# Patient Record
Sex: Male | Born: 1965 | Race: White | Hispanic: No | Marital: Single | Smoking: Never smoker
Health system: Southern US, Community
[De-identification: ages and names within clinical notes are randomized; demographics above are authoritative.]

## PROBLEM LIST (undated history)

## (undated) DIAGNOSIS — H9313 Tinnitus, bilateral: Secondary | ICD-10-CM

## (undated) DIAGNOSIS — N400 Enlarged prostate without lower urinary tract symptoms: Secondary | ICD-10-CM

## (undated) DIAGNOSIS — E785 Hyperlipidemia, unspecified: Secondary | ICD-10-CM

## (undated) DIAGNOSIS — G43809 Other migraine, not intractable, without status migrainosus: Secondary | ICD-10-CM

## (undated) HISTORY — DX: Hyperlipidemia, unspecified: E78.5

## (undated) HISTORY — PX: KNEE SURGERY: SHX244

## (undated) HISTORY — DX: Tinnitus, bilateral: H93.13

## (undated) HISTORY — DX: Benign prostatic hyperplasia without lower urinary tract symptoms: N40.0

## (undated) HISTORY — PX: APPENDECTOMY: SHX54

## (undated) HISTORY — PX: VASECTOMY: SHX75

---

## 2020-09-10 ENCOUNTER — Emergency Department (HOSPITAL_COMMUNITY)
Admission: EM | Admit: 2020-09-10 | Discharge: 2020-09-10 | Disposition: A | Discharge status: Home / Self Care | Payer: BC Managed Care – PPO | Attending: Emergency Medicine | Admitting: Emergency Medicine

## 2020-09-10 ENCOUNTER — Emergency Department (HOSPITAL_COMMUNITY): Payer: BC Managed Care – PPO

## 2020-09-10 ENCOUNTER — Other Ambulatory Visit: Payer: Self-pay

## 2020-09-10 DIAGNOSIS — R110 Nausea: Secondary | ICD-10-CM | POA: Diagnosis not present

## 2020-09-10 DIAGNOSIS — R42 Dizziness and giddiness: Secondary | ICD-10-CM

## 2020-09-10 DIAGNOSIS — H539 Unspecified visual disturbance: Secondary | ICD-10-CM | POA: Diagnosis not present

## 2020-09-10 LAB — URINALYSIS, ROUTINE W REFLEX MICROSCOPIC
Bilirubin Urine: NEGATIVE
Glucose, UA: NEGATIVE mg/dL
Hgb urine dipstick: NEGATIVE
Ketones, ur: NEGATIVE mg/dL
Leukocytes,Ua: NEGATIVE
Nitrite: NEGATIVE
Protein, ur: NEGATIVE mg/dL
Specific Gravity, Urine: 1.004 — ABNORMAL LOW (ref 1.005–1.030)
pH: 6 (ref 5.0–8.0)

## 2020-09-10 LAB — ETHANOL: Alcohol, Ethyl (B): <10 mg/dL (ref <10)

## 2020-09-10 LAB — CBC WITH DIFFERENTIAL/PLATELET
Abs Immature Granulocytes: 0.03 10*3/uL (ref 0.00–0.07)
Basophils Absolute: 0 10*3/uL (ref 0.0–0.1)
Basophils Relative: 0 %
Eosinophils Absolute: 0 10*3/uL (ref 0.0–0.5)
Eosinophils Relative: 0 %
HCT: 43.3 % (ref 39.0–52.0)
Hemoglobin: 14.9 g/dL (ref 13.0–17.0)
Immature Granulocytes: 0 %
Lymphocytes Relative: 19 %
Lymphs Abs: 1.8 10*3/uL (ref 0.7–4.0)
MCH: 29.7 pg (ref 26.0–34.0)
MCHC: 34.4 g/dL (ref 30.0–36.0)
MCV: 86.4 fL (ref 80.0–100.0)
Monocytes Absolute: 0.8 10*3/uL (ref 0.1–1.0)
Monocytes Relative: 8 %
Neutro Abs: 6.6 10*3/uL (ref 1.7–7.7)
Neutrophils Relative %: 73 %
Platelets: 265 10*3/uL (ref 150–400)
RBC: 5.01 MIL/uL (ref 4.22–5.81)
RDW: 14.3 % (ref 11.5–15.5)
WBC: 9.3 10*3/uL (ref 4.0–10.5)
nRBC: 0 % (ref 0.0–0.2)

## 2020-09-10 LAB — BASIC METABOLIC PANEL
Anion gap: 7 (ref 5–15)
BUN: 9 mg/dL (ref 6–20)
CO2: 25 mmol/L (ref 22–32)
Calcium: 9.7 mg/dL (ref 8.9–10.3)
Chloride: 102 mmol/L (ref 98–111)
Creatinine, Ser: 0.94 mg/dL (ref 0.61–1.24)
GFR, Estimated: >60 mL/min (ref >60)
Glucose, Bld: 122 mg/dL — ABNORMAL HIGH (ref 70–99)
Potassium: 3.8 mmol/L (ref 3.5–5.1)
Sodium: 134 mmol/L — ABNORMAL LOW (ref 135–145)

## 2020-09-10 LAB — RAPID URINE DRUG SCREEN, HOSP PERFORMED
Amphetamines: NOT DETECTED
Barbiturates: NOT DETECTED
Benzodiazepines: NOT DETECTED
Cocaine: NOT DETECTED
Opiates: NOT DETECTED
Tetrahydrocannabinol: NOT DETECTED

## 2020-09-10 LAB — PROTIME-INR
INR: 1.1 (ref 0.8–1.2)
Prothrombin Time: 13.3 seconds (ref 11.4–15.2)

## 2020-09-10 LAB — APTT: aPTT: 23 seconds — ABNORMAL LOW (ref 24–36)

## 2020-09-10 MED ORDER — LORAZEPAM 2 MG/ML IJ SOLN
1.0000 mg | Freq: Once | INTRAMUSCULAR | Status: AC
  Administered 2020-09-10: 1 mg via INTRAVENOUS
  Filled 2020-09-10: qty 1

## 2020-09-10 MED ORDER — MECLIZINE HCL 25 MG PO TABS: 25.0000 mg | ORAL_TABLET | Freq: Three times a day (TID) | ORAL | 0 refills | Status: DC | PRN

## 2020-09-10 NOTE — ED Triage Notes (Addendum)
Pt said all day he has been feeling dizzy and feels like he cant turn his head fast or open his eyes to fast.When he stands to fast the room spins. Pt said he has been sick to stomach today bc of the dizziness. Pt said he has no headache no numbness or tingling in hand or feet. All grips and strengths are normal. Pt said he has changed his dose of Flomax from 1 every 4 days to 1 every 2 days.

## 2020-09-10 NOTE — Discharge Instructions (Signed)

## 2020-09-10 NOTE — ED Notes (Signed)
Pt transported to MRI 

## 2020-09-10 NOTE — ED Provider Notes (Signed)
[facility] [place] EMERGENCY DEPARTMENT Provider Note   CSN: [id number] Arrival date & time: 09/10/20  9767     History Chief Complaint  Patient presents with  . Dizziness    Matthew Harmon is a 55 y.o. male.  The history is provided by the patient and a relative.  Dizziness Quality:  Room spinning Severity:  Severe Onset quality:  Sudden Duration:  12 hours Timing:  Intermittent Progression:  Worsening Chronicity:  New Context: head movement   Relieved by:  Being still Worsened by:  Eye movement, movement and turning head Associated symptoms: nausea and vision changes   Associated symptoms: no chest pain, no headaches, no hearing loss, no shortness of breath, no syncope, no tinnitus and no weakness   Patient presents with dizziness and vertigo.  This began greater than 12 hours prior to arrival.  Patient reports over 2 days ago he had a very brief episode of similar symptoms that resolved spontaneously.  However at approximately 1:30 PM on March 12 he began having the symptoms again.  He reports the room is spinning and is triggered by any head movement. Is causing nausea.  No focal weakness.  He is having difficulty walking.  He reports that he recently increased his Flomax. He does report seeing double at times.  No headache.  No trauma. 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  Review of Systems   Review of Systems  Constitutional: Negative for fever.  HENT: Negative for hearing loss and tinnitus.   Eyes: Positive for visual disturbance.  Respiratory: Negative for shortness of breath.   Cardiovascular: Negative for chest pain and syncope.  Gastrointestinal: Positive for nausea.  Neurological: Positive for dizziness. Negative for  speech difficulty, weakness and headaches.  All other systems reviewed and are negative.   Physical Exam Updated Vital Signs BP (!) 143/95   Pulse 62   Temp 98.5 F (36.9 C)   Resp 13   Ht 1.778 m (5\' 10" )   Wt 83.9 kg   SpO2 99%   BMI 26.54 kg/m   Physical Exam CONSTITUTIONAL: Well developed/well nourished HEAD: Normocephalic/atraumatic EYES: EOMI/PERRL,  no ptosis, patient has both vertical and horizontal nystagmus ENMT: Mucous membranes moist, bilateral TMs occluded by cerumen NECK: supple no meningeal signs, no bruits CV: S1/S2 noted, no murmurs/rubs/gallops noted LUNGS: Lungs are clear to auscultation bilaterally, no apparent distress ABDOMEN: soft, nontender, no rebound or guarding GU:no cva tenderness NEURO:Awake/alert, face symmetric, no arm or leg drift is noted Equal 5/5 strength with shoulder abduction, elbow flex/extension, wrist flex/extension in upper extremities and equal hand grips bilaterally Equal 5/5 strength with hip flexion,knee flex/extension, foot dorsi/plantar flexion Cranial nerves 3/4/5/6/01/06/09/11/12 tested and intact Mild ataxia noted Past-pointing on the right Sensation to light touch intact in all extremities EXTREMITIES: pulses normal, full ROM SKIN: warm, color normal PSYCH: no abnormalities of mood noted  ED Results / Procedures / Treatments   Labs (all labs ordered are listed, but only abnormal results are displayed) Labs Reviewed  BASIC METABOLIC PANEL - Abnormal; Notable for the following components:      Result Value   Sodium 134 (*)    Glucose, Bld 122 (*)    All other components within normal limits  APTT - Abnormal; Notable for the following components:   aPTT 23 (*)    All other components within normal limits  URINALYSIS, ROUTINE W REFLEX MICROSCOPIC - Abnormal; Notable for the following components:   Color, Urine STRAW (*)    Specific Gravity, Urine 1.004 (*)    All other components within normal limits  CBC WITH  DIFFERENTIAL/PLATELET  ETHANOL  PROTIME-INR  RAPID URINE DRUG SCREEN, HOSP PERFORMED    EKG EKG Interpretation  Date/Time:  Sunday September 10 2020 03:28:34 EDT Ventricular Rate:  69 PR Interval:  162 QRS Duration: 94 QT Interval:  386 QTC Calculation: 413 R Axis:   75 Text Interpretation: Normal sinus rhythm with sinus arrhythmia Normal ECG No previous ECGs available Confirmed by Zadie Rhine ([id number]) on 09/10/2020 4:37:57 AM   Radiology MR BRAIN WO CONTRAST  Result Date: 09/10/2020 CLINICAL DATA:  55 year old male with persistent dizziness. EXAM: MRI HEAD WITHOUT CONTRAST TECHNIQUE: Multiplanar, multiecho pulse sequences of the brain and surrounding structures were obtained without intravenous contrast. COMPARISON:  None. FINDINGS: Brain: No restricted diffusion to suggest acute infarction. No midline shift, mass effect, evidence of mass lesion, ventriculomegaly, extra-axial collection or acute intracranial hemorrhage. Cervicomedullary junction and pituitary are within normal limits. Cerebral volume is within normal limits for age. Largely normal gray and white matter signal for age throughout the brain, minimal nonspecific small subcortical white matter foci of T2 and FLAIR hyperintensity primarily in the frontal lobes (series 11, image 16). No cortical encephalomalacia or chronic cerebral blood products. Deep gray matter nuclei, brainstem and cerebellum are within normal limits. Vascular: Major intracranial vascular flow voids are preserved. Skull and upper cervical spine: Negative. Sinuses/Orbits: Rightward gaze. Otherwise negative orbits. Paranasal sinuses and mastoids are clear. Other: Visible internal auditory structures appear normal. Normal stylomastoid foramina. Scalp and face appear negative. IMPRESSION: No acute intracranial abnormality and essentially normal for age noncontrast MRI appearance of the brain. Electronically Signed   By: Odessa Fleming M.D.   On: 09/10/2020 07:11     Procedures Procedures   Medications Ordered in ED Medications  LORazepam (ATIVAN) injection 1 mg (1 mg Intravenous Given 09/10/20 9201)    ED Course  I have reviewed the triage vital signs and the nursing notes.  Pertinent labs & imaging results that were available during my care of the patient were reviewed by me and considered in my medical decision making (see chart for details).    MDM Rules/Calculators/A&P                          6:07 AM Patient presents with symptoms greater than 12 hours.  Patient reports the room is spinning.  Significant findings include horizontal and vertical nystagmus, as well as past-pointing and mild ataxia.  Patient also reports diplopia at times Patient has previous history of CVA. We will proceed with MRI.  Will give Ativan for symptoms.  tPA in stroke considered but not given due to: Onset over 3-4.5hours 7:29 AM MRI negative.  Patient is feeling improved and taking p.o. fluids.  Suspect this is peripheral vertigo.  He also thinks this may be related to increase in his Flomax.  He will scale this back.  He will be referred to otolaryngology.   Final Clinical Impression(s) / ED Diagnoses Final diagnoses:  Vertigo    Rx / DC Orders ED Discharge Orders         Ordered    meclizine (ANTIVERT) 25 MG tablet  3 times  daily PRN        09/10/20 1610           Zadie Rhine, MD 09/10/20 0730

## 2021-05-22 ENCOUNTER — Encounter: Payer: Self-pay | Admitting: Neurology

## 2021-05-22 ENCOUNTER — Other Ambulatory Visit: Payer: Self-pay

## 2021-05-22 ENCOUNTER — Ambulatory Visit: Payer: BC Managed Care – PPO | Admitting: Neurology

## 2021-05-22 VITALS — BP 132/72 | HR 72 | Ht 70.0 in | Wt 184.0 lb

## 2021-05-22 DIAGNOSIS — H8121 Vestibular neuronitis, right ear: Secondary | ICD-10-CM | POA: Diagnosis not present

## 2021-05-22 NOTE — Patient Instructions (Signed)
Follow up with your primary care doctor and return if worse  °

## 2021-05-22 NOTE — Progress Notes (Signed)
[facility]  PATIENT: Matthew Harmon DOB: 1966/06/03  REQUESTING CLINICIAN: Street, Stephanie Coup, * HISTORY FROM: Patient  REASON FOR VISIT: Sensitivity to light an noise recently diagnosed with vestibular neuritis   HISTORICAL  CHIEF COMPLAINT:  Chief Complaint  Patient presents with   New Patient (Initial Visit)    Rm 12. Pt referred for sensory hypersensitivity. Pt c/o of sensitivity to light and noise.    HISTORY OF PRESENT ILLNESS:  This is a 55 year old gentleman past medical history of hypertension, BPH recently diagnosed with right vestibular neuritis after having ongoing dizziness, vertigo, ringing in the ear.  Since the diagnosis of vestibular neuritis he has completed vestibular PT, but stated that the symptoms are still persistent.  Symptoms started on April/May and now is having mostly sensitivity to light and noise, stated that light or noise can cause him to have brain fog, he cannot focus and cannot think straight.  Reported he will have nausea with head movement but no vomiting, will get lightheaded sometimes with head movement.  Work-up done at work [facility] include a brain MRI which showed no acute intracranial abnormality, essentially normal noncontrast MRI brain.  He is present today to rule out central cause of the vestibular neuritis or any vestibular syndrome.  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  Reported that the diagnosis has affected his lifestyle, but he is trying to stay busy by remodeling his house.  He lives with his son who is 9 year old and going to college.     OTHER MEDICAL CONDITIONS: HTN, BPH   REVIEW OF SYSTEMS: Full 14 system review of systems performed and negative with exception of: as noted in the HPI.   ALLERGIES: Allergies  Allergen Reactions   Biaxin Xl [Clarithromycin] Diarrhea and Palpitations    HOME  MEDICATIONS: Outpatient Medications Prior to Visit  Medication Sig Dispense Refill   amLODipine (NORVASC) 5 MG tablet Take 5 mg by mouth daily.     ramipril (ALTACE) 10 MG capsule Take 10 mg by mouth daily.     tadalafil (CIALIS) 5 MG tablet Take 5 mg by mouth daily.     meclizine (ANTIVERT) 25 MG tablet Take 1 tablet (25 mg total) by mouth 3 (three) times daily as needed for dizziness. 30 tablet 0   No facility-administered medications prior to visit.    PAST MEDICAL HISTORY: Past Medical History:  Diagnosis Date   BPH (benign prostatic hyperplasia)    Dyslipidemia    Tinnitus of both ears     PAST SURGICAL HISTORY: Past Surgical History:  Procedure Laterality Date   APPENDECTOMY     KNEE SURGERY     VASECTOMY      FAMILY HISTORY: Family History  Problem Relation Age of Onset   High blood pressure Mother    Prostate cancer Father    Heart disease Father    High blood pressure Father     SOCIAL HISTORY: Social History   Socioeconomic History   Marital status: Single    Spouse name: Not on file   Number of children: Not on file   Years of education: Not on file   Highest education level: Not on file  Occupational History   Not on file  Tobacco Use   Smoking status: Never   Smokeless tobacco: Never  Substance and Sexual Activity   Alcohol use: Yes    Comment:  occasional   Drug use: Not on file   Sexual activity: Not on file  Other Topics Concern   Not on file  Social History Narrative   Not on file   Social Determinants of Health   Financial Resource Strain: Not on file  Food Insecurity: Not on file  Transportation Needs: Not on file  Physical Activity: Not on file  Stress: Not on file  Social Connections: Not on file  Intimate Partner Violence: Not on file    PHYSICAL EXAM  GENERAL EXAM/CONSTITUTIONAL: Vitals:  Vitals:   05/22/21 1402  BP: 132/72  Pulse: 72  Weight: 184 lb (83.5 kg)  Height: 5\' 10"  (1.778 m)   Body mass index is 26.4  kg/m. Wt Readings from Last 3 Encounters:  05/22/21 184 lb (83.5 kg)  09/10/20 185 lb (83.9 kg)   Patient is in no distress; well developed, nourished and groomed; neck is supple  CARDIOVASCULAR: Examination of carotid arteries is normal; no carotid bruits Regular rate and rhythm, no murmurs Examination of peripheral vascular system by observation and palpation is normal  EYES: Pupils round and reactive to light, Visual fields full to confrontation, Extraocular movements intacts,   MUSCULOSKELETAL: Gait, strength, tone, movements noted in Neurologic exam below  NEUROLOGIC: MENTAL STATUS:  No flowsheet data found. awake, alert, oriented to person, place and time recent and remote memory intact normal attention and concentration language fluent, comprehension intact, naming intact fund of knowledge appropriate  CRANIAL NERVE:  2nd, 3rd, 4th, 6th - pupils equal and reactive to light, visual fields full to confrontation, extraocular muscles intact, no nystagmus 5th - facial sensation symmetric 7th - facial strength symmetric 8th - hearing intact 9th - palate elevates symmetrically, uvula midline 11th - shoulder shrug symmetric 12th - tongue protrusion midline  MOTOR:  normal bulk and tone, full strength in the BUE, BLE  SENSORY:  normal and symmetric to light touch, pinprick, temperature, vibration  COORDINATION:  finger-nose-finger, fine finger movements normal  REFLEXES:  deep tendon reflexes present and symmetric  GAIT/STATION:  normal   DIAGNOSTIC DATA (LABS, IMAGING, TESTING) - I reviewed patient records, labs, notes, testing and imaging myself where available.  Lab Results  Component Value Date   WBC 9.3 09/10/2020   HGB 14.9 09/10/2020   HCT 43.3 09/10/2020   MCV 86.4 09/10/2020   PLT 265 09/10/2020      Component Value Date/Time   NA 134 (L) 09/10/2020 0336   K 3.8 09/10/2020 0336   CL 102 09/10/2020 0336   CO2 25 09/10/2020 0336   GLUCOSE 122  (H) 09/10/2020 0336   BUN 9 09/10/2020 0336   CREATININE 0.94 09/10/2020 0336   CALCIUM 9.7 09/10/2020 0336   GFRNONAA >60 09/10/2020 0336   No results found for: CHOL, HDL, LDLCALC, LDLDIRECT, TRIG, CHOLHDL No results found for: 09/12/2020 No results found for: VITAMINB12 No results found for: TSH  11/17/20 MRI Temporal Bone/IAC WWO: No vestibular schwannoma. Extension of the right anterior inferior cerebellar artery loop deep into the right internal auditory canal with possible deflection of the right vestibulocochlear nerve, a nonspecific finding.  09/10/2020 MRI Brain without contrast  No acute intracranial abnormality and essentially normal for age noncontrast MRI appearance of the brain.    ASSESSMENT AND PLAN  55 y.o. year old male with past medical history of hypertension and BPH who is presenting with complaint of sensitivity to light and noise causing him to have brain fog, difficulty thinking straight and difficulty concentrating.  He was  recently diagnosed with right vestibular neuritis causing the symptoms of disequilibrium, lightheadedness and vertigo.  During this work-up his brain MRI was normal and he also have a MRI temporal bone and interior acoustic canal which was also normal, no evidence of vestibular schwannoma.  Currently I do not see any central neurological process that can cause his symptoms.  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 I also advised him to seek help with a counselor, therapist or psychiatrist to help him with his depressed mood.  Follow-up with your primary care doctor and return if worse   1. Vestibular neuronitis of right ear     PLAN: Follow up with your primary care doctor and return if worse    No orders of the defined types were placed in this  encounter.   No orders of the defined types were placed in this encounter.   Return if symptoms worsen or fail to improve.    Windell Norfolk, MD 05/22/2021, 11:21 PM  [facility] [address] [place], [place] [address] [phone]

## 2022-07-25 IMAGING — MR MR HEAD W/O CM
14 of 16 series · 35 of 48 positions shown · non-contrast
Comparison: None.

CLINICAL DATA: 54-year-old male with persistent dizziness.

EXAM:
MRI HEAD WITHOUT CONTRAST
TECHNIQUE: Multiplanar, multiecho pulse sequences of the brain and surrounding
structures were obtained without intravenous contrast.

[Series 5: DWI · axial · 3.0mm · 0.88mm/px · z∈[-73,+72]mm · 5 of 100 slices shown (1 of 4)]
[im 1/100]
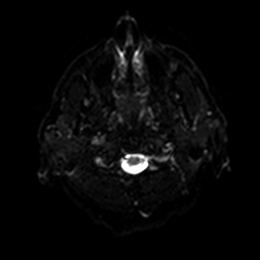
[im 25/100]
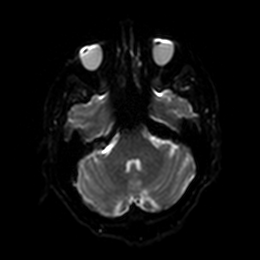
[im 50/100]
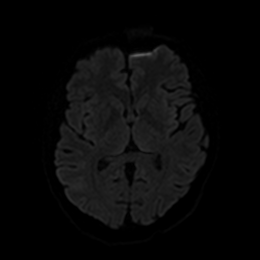
[im 75/100]
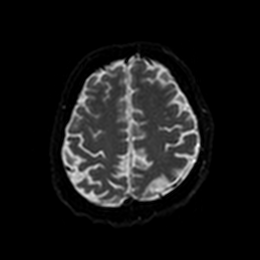
[im 100/100]
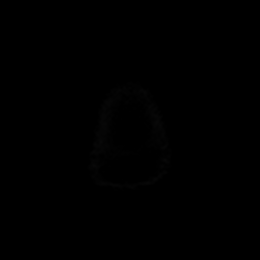

[Series 6: DWI · axial · 3.0mm · 0.88mm/px · z∈[-73,+72]mm · 3 of 47 slices shown (2 of 4)]
[im 1/47]
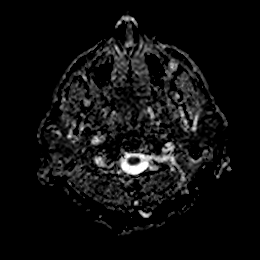
[im 24/47]
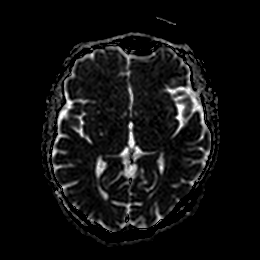
[im 47/47]
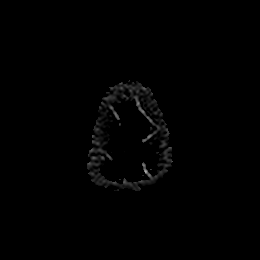

[Series 7: DWI · coronal · 4.0mm · 0.88mm/px · 3 of 66 slices shown (3 of 4)]
[im 1/66]
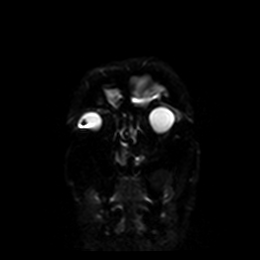
[im 33/66]
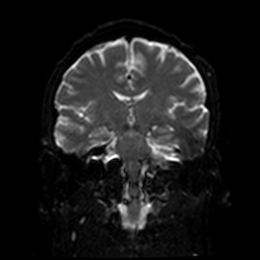
[im 66/66]
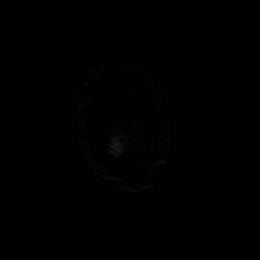

[Series 8: DWI · coronal · 4.0mm · 0.88mm/px · 1 of 33 slices shown (4 of 4)]
[im 1/33]
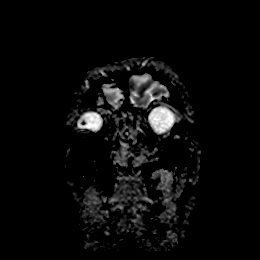

[Series 9: T1 · sagittal · 5.0mm · 0.75mm/px · 1 of 25 slices shown]
[im 1/25]
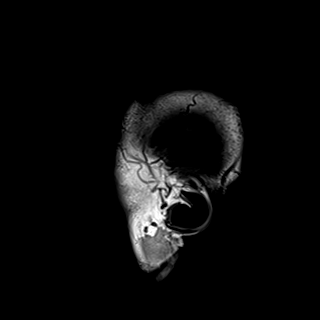

[Series 10: T2 · axial · 5.0mm · 0.72mm/px · 1 of 27 slices shown (1 of 2)]
[im 1/27]
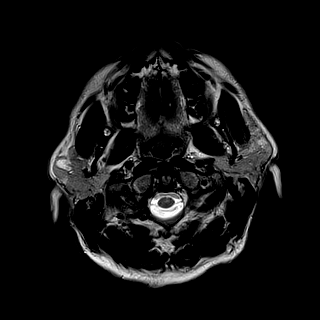

[Series 11: FLAIR · axial · 5.0mm · 0.90mm/px · 1 of 27 slices shown]
[im 1/27]
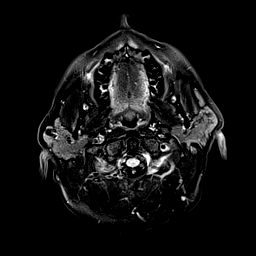

[Series 12: mag_images · axial · 3.0mm · 0.90mm/px · z∈[-81,+82]mm · 2 of 56 slices shown]
[im 1/56]
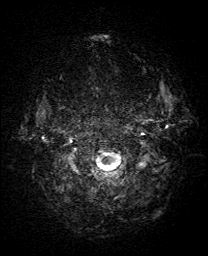
[im 56/56]
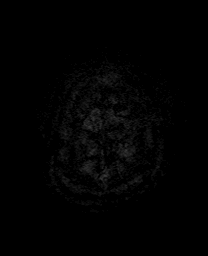

[Series 13: pha_images · axial · 3.0mm · 0.90mm/px · z∈[-81,+80]mm · 2 of 55 slices shown]
[im 1/55]
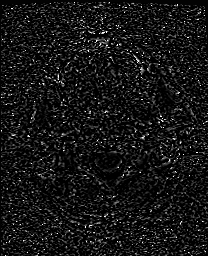
[im 55/55]
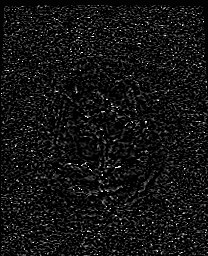

[Series 14: swi_images · axial · 3.0mm · 0.90mm/px · z∈[-81,+82]mm · 2 of 56 slices shown]
[im 1/56]
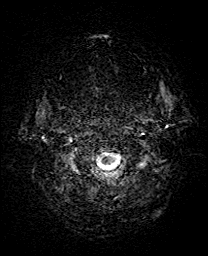
[im 56/56]
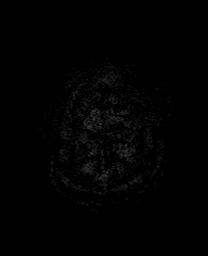

[Series 15: mip_images(sw) · axial · 24.0mm · 0.90mm/px · z∈[-71,+72]mm · 2 of 49 slices shown]
[im 1/49]
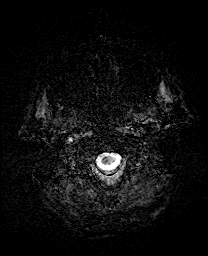
[im 49/49]
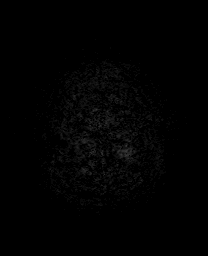

[Series 17: T2 · coronal · 5.0mm · 0.34mm/px · 1 of 30 slices shown (2 of 2)]
[im 1/30]
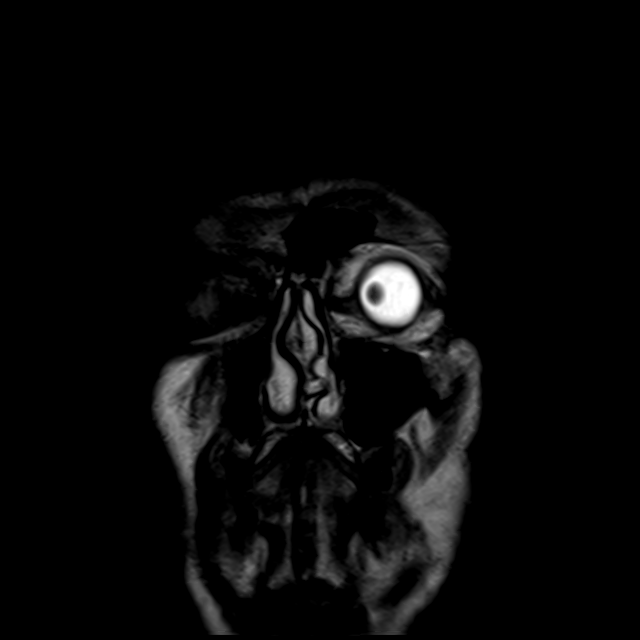

[Series 18: t2_space_dark-fluid_sag_p2_ns-ir · sagittal · 1.0mm · 0.47mm/px · 7 of 176 slices shown]
[im 1/176]
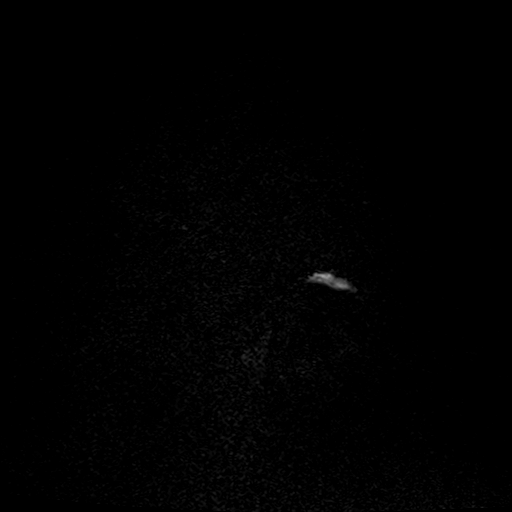
[im 30/176]
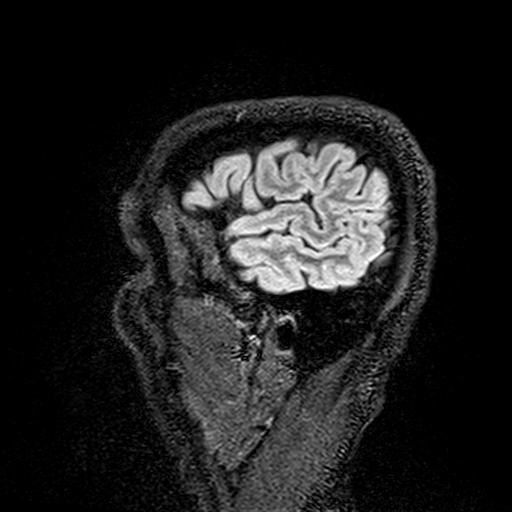
[im 59/176]
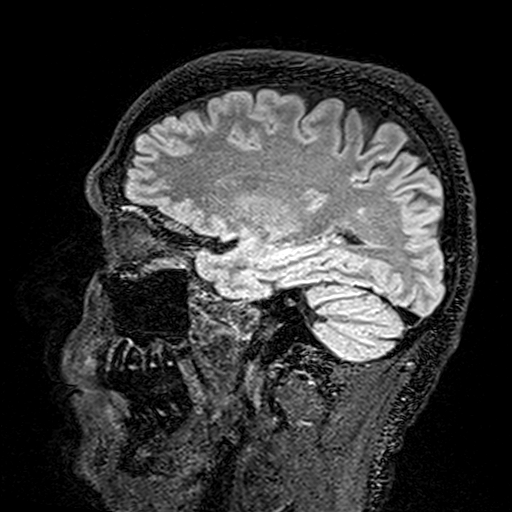
[im 88/176]
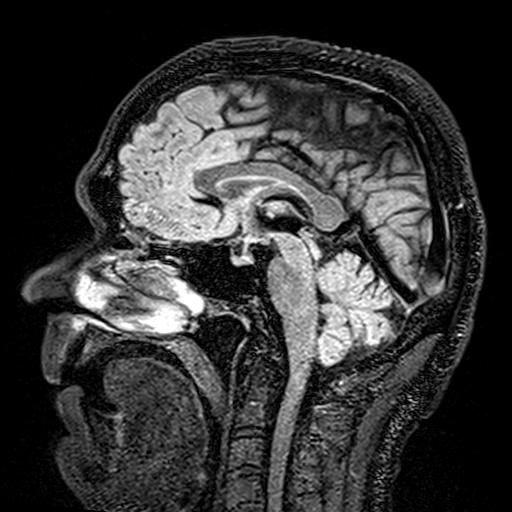
[im 117/176]
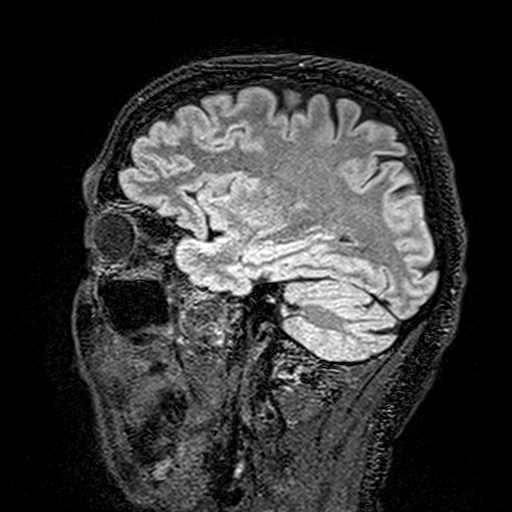
[im 146/176]
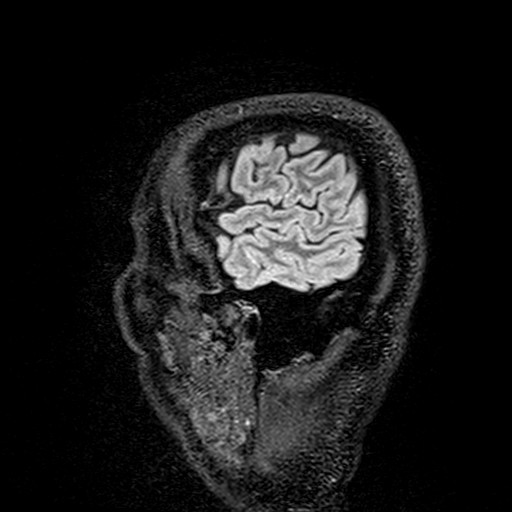
[im 176/176]
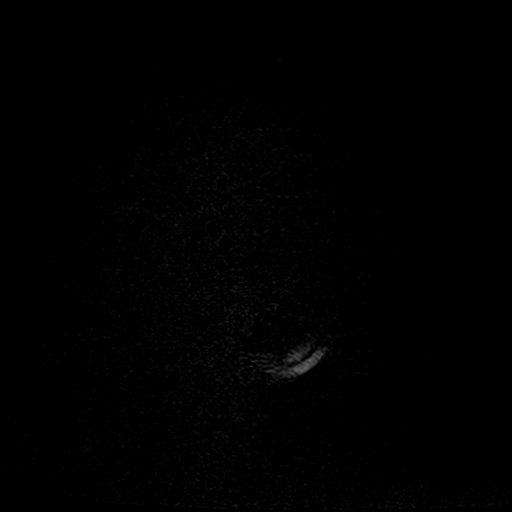

[Series 19: t2_space_dark-fluid_sag_p2_ns-ir_mpr_ axial · axial · 1.0mm · 0.45mm/px · z∈[-82,-2]mm · 4 of 164 slices shown]
[im 1/164]
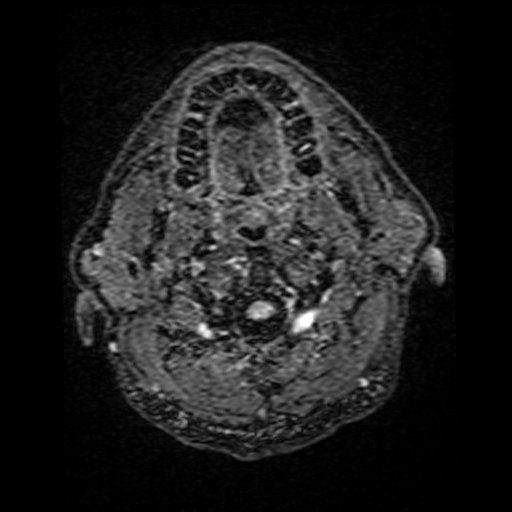
[im 28/164]
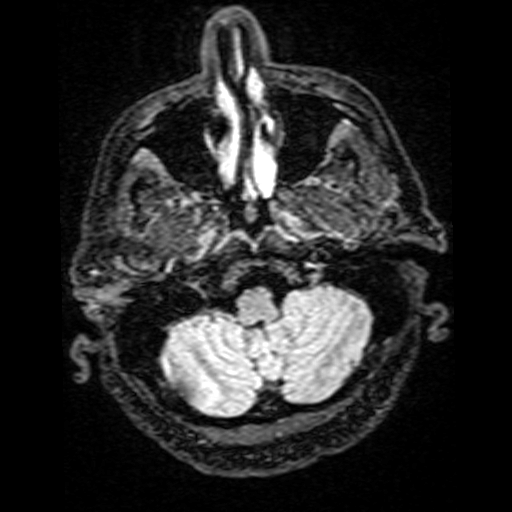
[im 55/164]
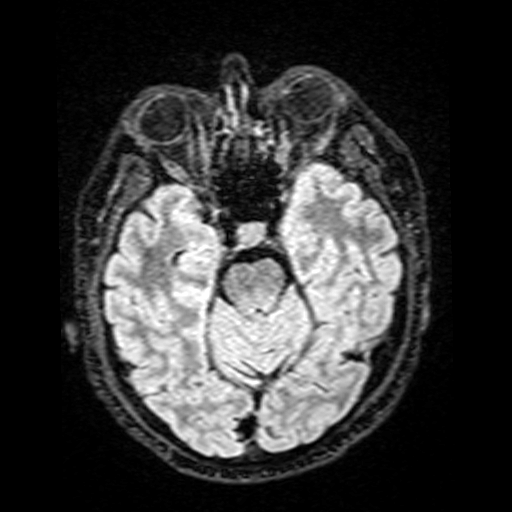
[im 82/164]
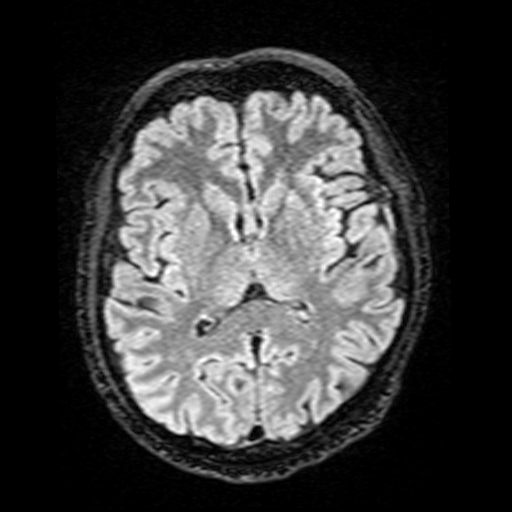

[35 of 48 positions shown; findings below may reference images not displayed]

FINDINGS: Brain: No restricted diffusion to suggest acute infarction. No
midline shift, mass effect, evidence of mass lesion,
ventriculomegaly, extra-axial collection or acute intracranial
hemorrhage. Cervicomedullary junction and pituitary are within
normal limits. Cerebral volume is within normal limits for age.
Largely normal gray and white matter signal for age throughout the
brain, minimal nonspecific small subcortical white matter foci of T2
and FLAIR hyperintensity primarily in the frontal lobes (series 11,
image 16). No cortical encephalomalacia or chronic cerebral blood
products. Deep gray matter nuclei, brainstem and cerebellum are
within normal limits.

Vascular: Major intracranial vascular flow voids are preserved.

Skull and upper cervical spine: Negative.

Sinuses/Orbits: Rightward gaze. Otherwise negative orbits. Paranasal
sinuses and mastoids are clear.

Other: Visible internal auditory structures appear normal. Normal
stylomastoid foramina. Scalp and face appear negative.
IMPRESSION: No acute intracranial abnormality and essentially normal for age
noncontrast MRI appearance of the brain.

## 2024-07-24 ENCOUNTER — Ambulatory Visit (HOSPITAL_BASED_OUTPATIENT_CLINIC_OR_DEPARTMENT_OTHER): Admission: EM | Admit: 2024-07-24 | Discharge: 2024-07-24 | Disposition: A | Discharge status: Home / Self Care

## 2024-07-24 ENCOUNTER — Encounter (HOSPITAL_BASED_OUTPATIENT_CLINIC_OR_DEPARTMENT_OTHER): Payer: Self-pay | Admitting: Emergency Medicine

## 2024-07-24 DIAGNOSIS — H9202 Otalgia, left ear: Secondary | ICD-10-CM | POA: Diagnosis not present

## 2024-07-24 DIAGNOSIS — M542 Cervicalgia: Secondary | ICD-10-CM

## 2024-07-24 HISTORY — DX: Other migraine, not intractable, without status migrainosus: G43.809

## 2024-07-24 NOTE — ED Provider Notes (Signed)
 " Matthew Harmon    CSN: [id number] Arrival date & time: 07/24/24  1450      History   Chief Complaint Chief Complaint  Patient presents with   Otalgia    HPI Matthew Harmon is a 59 y.o. male.   60 year old male with complaint of left ear pain that shot down his neck.  It occurred on the night of 07/23/2024 and again earlier today.  He denies fever, cough, runny nose, congestion, nausea, vomiting, constipation, diarrhea.   Otalgia Associated symptoms: no abdominal pain, no cough, no diarrhea, no fever, no rash, no sore throat and no vomiting     Past Medical History:  Diagnosis Date   BPH (benign prostatic hyperplasia)    Dyslipidemia    Tinnitus of both ears    Vestibular migraine     There are no active problems to display for this patient.   Past Surgical History:  Procedure Laterality Date   APPENDECTOMY     KNEE SURGERY     VASECTOMY         Home Medications    Prior to Admission medications  Medication Sig Start Date End Date Taking? Authorizing Provider  amLODipine (NORVASC) 5 MG tablet Take 5 mg by mouth daily. 09/12/20  Yes [provider]  tadalafil (CIALIS) 5 MG tablet Take 5 mg by mouth daily. 05/10/21  Yes [provider]  EMGALITY 120 MG/ML SOAJ Inject 1 mL into the skin every 30 (thirty) days.    [provider]  ramipril (ALTACE) 10 MG capsule Take 10 mg by mouth daily. 05/10/21   [provider]    Family History Family History  Problem Relation Age of Onset   High blood pressure Mother    Prostate cancer Father    Heart disease Father    High blood pressure Father     Social History Social History[1]   Allergies   Biaxin xl [clarithromycin]   Review of Systems Review of Systems  Constitutional:  Negative for chills and fever.  HENT:  Positive for ear pain. Negative for sore throat.   Eyes:  Negative for pain and visual disturbance.  Respiratory:  Negative for cough.    Cardiovascular:  Negative for chest pain and palpitations.  Gastrointestinal:  Negative for abdominal pain, constipation, diarrhea, nausea and vomiting.  Genitourinary:  Negative for dysuria and hematuria.  Musculoskeletal:  Negative for arthralgias and back pain.  Skin:  Negative for color change and rash.  Neurological:  Negative for seizures and syncope.  All other systems reviewed and are negative.    Physical Exam Triage Vital Signs ED Triage Vitals  Encounter Vitals Group     BP 07/24/24 1517 126/81     Girls Systolic BP Percentile --      Girls Diastolic BP Percentile --      Boys Systolic BP Percentile --      Boys Diastolic BP Percentile --      Pulse Rate 07/24/24 1517 73     Resp 07/24/24 1517 18     Temp 07/24/24 1517 97.7 F (36.5 C)     Temp Source 07/24/24 1517 Oral     SpO2 07/24/24 1517 96 %     Weight --      Height --      Head Circumference --      Peak Flow --      Pain Score 07/24/24 1513 0     Pain Loc --  Pain Education --      Exclude from Growth Chart --    No data found.  Updated Vital Signs BP 126/81 (BP Location: Left Arm)   Pulse 73   Temp 97.7 F (36.5 C) (Oral)   Resp 18   SpO2 96%   Visual Acuity Right Eye Distance:   Left Eye Distance:   Bilateral Distance:    Right Eye Near:   Left Eye Near:    Bilateral Near:     Physical Exam Vitals and nursing note reviewed.  Constitutional:      General: He is not in acute distress.    Appearance: He is well-developed. He is not ill-appearing, toxic-appearing or diaphoretic.  HENT:     Head: Normocephalic and atraumatic.     Right Ear: Hearing, tympanic membrane, ear canal and external ear normal.     Left Ear: Hearing, tympanic membrane, ear canal and external ear normal.     Nose: No congestion or rhinorrhea.     Right Sinus: No maxillary sinus tenderness or frontal sinus tenderness.     Left Sinus: No maxillary sinus tenderness or frontal sinus tenderness.      Mouth/Throat:     Lips: Pink.     Mouth: Mucous membranes are moist.     Dentition: Normal dentition.     Tongue: No lesions.     Pharynx: Uvula midline. No oropharyngeal exudate or posterior oropharyngeal erythema.     Tonsils: No tonsillar exudate.  Eyes:     Conjunctiva/sclera: Conjunctivae normal.     Pupils: Pupils are equal, round, and reactive to light.  Cardiovascular:     Rate and Rhythm: Normal rate and regular rhythm.     Heart sounds: S1 normal and S2 normal. No murmur heard. Pulmonary:     Effort: Pulmonary effort is normal. No respiratory distress.     Breath sounds: Normal breath sounds. No decreased breath sounds, wheezing, rhonchi or rales.  Abdominal:     General: Bowel sounds are normal.     Palpations: Abdomen is soft.     Tenderness: There is no abdominal tenderness.  Musculoskeletal:        General: No swelling.     Cervical back: Neck supple.  Lymphadenopathy:     Head:     Right side of head: No submental, submandibular, tonsillar, preauricular or posterior auricular adenopathy.     Left side of head: No submental, submandibular, tonsillar, preauricular or posterior auricular adenopathy.     Cervical: No cervical adenopathy.     Right cervical: No superficial cervical adenopathy.    Left cervical: No superficial cervical adenopathy.  Skin:    General: Skin is warm and dry.     Capillary Refill: Capillary refill takes less than 2 seconds.     Findings: No rash.  Neurological:     Mental Status: He is alert and oriented to person, place, and time.  Psychiatric:        Mood and Affect: Mood normal.      UC Treatments / Results  Labs (all labs ordered are listed, but only abnormal results are displayed) Labs Reviewed - No data to display  EKG   Radiology No results found.  Procedures Procedures (including critical Harmon time)  Medications Ordered in UC Medications - No data to display  Initial Impression / Assessment and Plan / UC Course   I have reviewed the triage vital signs and the nursing notes.  Pertinent labs & imaging results that were  available during my Harmon of the patient were reviewed by me and considered in my medical decision making (see chart for details).  Plan of Harmon (see discharge instructions for additional patient precautions and education): Earache radiating to the neck: Exam is normal.  May use acetaminophen or ibuprofen, if needed for ear pain.  Get plenty of fluids and rest.  Follow-up with primary Harmon or return here if symptoms do not resolve, symptoms worsen or new symptoms occur.  I reviewed the plan of Harmon with the patient and/or the patient's guardian.  The patient and/or guardian had time to ask questions and acknowledged that the questions were answered.  Final Clinical Impressions(s) / UC Diagnoses   Final diagnoses:  Left ear pain  Neck pain     Discharge Instructions      Earache radiating to the neck: Exam is normal.  May use acetaminophen or ibuprofen, if needed for ear pain.  Get plenty of fluids and rest.  Follow-up with primary Harmon or return here if symptoms do not resolve, symptoms worsen or new symptoms occur.     ED Prescriptions   None    PDMP not reviewed this encounter.    [1]  Social History Tobacco Use   Smoking status: Never   Smokeless tobacco: Never  Vaping Use   Vaping status: Never Used  Substance Use Topics   Alcohol use: Yes    Comment: occasional   Drug use: Never     Ival Domino, FNP 07/24/24 1544  "

## 2024-07-24 NOTE — Discharge Instructions (Addendum)
 Earache radiating to the neck: Exam is normal.  May use acetaminophen or ibuprofen, if needed for ear pain.  Get plenty of fluids and rest.  Follow-up with primary care or return here if symptoms do not resolve, symptoms worsen or new symptoms occur.

## 2024-07-24 NOTE — ED Triage Notes (Signed)
 Pt reports last night he had left ear pain and pain did shoot down his neck and it also occurred today.
